# Patient Record
Sex: Male | Born: 1985 | Race: White | Hispanic: No | Marital: Single | Smoking: Current every day smoker
Health system: Southern US, Community
[De-identification: ages and names within clinical notes are randomized; demographics above are authoritative.]

## PROBLEM LIST (undated history)

## (undated) HISTORY — PX: MYRINGOTOMY WITH TUBE PLACEMENT: SHX5663

---
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
## 2006-04-09 ENCOUNTER — Emergency Department (HOSPITAL_COMMUNITY): Admission: EM | Admit: 2006-04-09 | Discharge: 2006-04-09 | Payer: Self-pay | Admitting: Emergency Medicine

## 2006-04-11 ENCOUNTER — Encounter: Admission: RE | Admit: 2006-04-11 | Discharge: 2006-04-11 | Payer: Self-pay | Admitting: Internal Medicine

## 2006-10-04 ENCOUNTER — Encounter: Admission: RE | Admit: 2006-10-04 | Discharge: 2006-10-04 | Payer: Self-pay | Admitting: Otolaryngology

## 2006-10-14 ENCOUNTER — Encounter: Admission: RE | Admit: 2006-10-14 | Discharge: 2006-10-14 | Payer: Self-pay | Admitting: Orthopedic Surgery

## 2006-11-12 ENCOUNTER — Encounter: Admission: RE | Admit: 2006-11-12 | Discharge: 2006-11-12 | Payer: Self-pay | Admitting: Otolaryngology

## 2006-11-12 ENCOUNTER — Encounter: Admission: RE | Admit: 2006-11-12 | Discharge: 2006-11-12 | Payer: Self-pay | Admitting: Orthopedic Surgery

## 2007-02-14 ENCOUNTER — Emergency Department (HOSPITAL_COMMUNITY): Admission: EM | Admit: 2007-02-14 | Discharge: 2007-02-14 | Payer: Self-pay | Admitting: Emergency Medicine

## 2007-02-14 ENCOUNTER — Inpatient Hospital Stay (HOSPITAL_COMMUNITY): Admission: RE | Admit: 2007-02-14 | Discharge: 2007-02-18 | Payer: Self-pay | Admitting: Psychiatry

## 2007-02-14 ENCOUNTER — Ambulatory Visit: Payer: Self-pay | Admitting: Psychiatry

## 2007-02-24 ENCOUNTER — Other Ambulatory Visit (HOSPITAL_COMMUNITY): Admission: RE | Admit: 2007-02-24 | Discharge: 2007-04-16 | Payer: Self-pay | Admitting: Psychiatry

## 2007-02-24 ENCOUNTER — Ambulatory Visit: Payer: Self-pay | Admitting: Psychiatry

## 2007-04-16 ENCOUNTER — Ambulatory Visit: Payer: Self-pay | Admitting: Psychiatry

## 2007-05-14 ENCOUNTER — Ambulatory Visit (HOSPITAL_COMMUNITY): Payer: Self-pay | Admitting: Psychiatry

## 2007-06-04 ENCOUNTER — Ambulatory Visit (HOSPITAL_COMMUNITY): Payer: Self-pay | Admitting: Psychiatry

## 2007-09-10 ENCOUNTER — Ambulatory Visit (HOSPITAL_COMMUNITY): Payer: Self-pay | Admitting: Psychiatry

## 2007-11-05 ENCOUNTER — Ambulatory Visit (HOSPITAL_COMMUNITY): Payer: Self-pay | Admitting: Psychiatry

## 2007-12-07 ENCOUNTER — Emergency Department (HOSPITAL_COMMUNITY): Admission: EM | Admit: 2007-12-07 | Discharge: 2007-12-07 | Payer: Self-pay | Admitting: Emergency Medicine

## 2008-02-04 ENCOUNTER — Ambulatory Visit (HOSPITAL_COMMUNITY): Payer: Self-pay | Admitting: Psychiatry

## 2010-03-18 ENCOUNTER — Encounter: Payer: Self-pay | Admitting: Otolaryngology
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
## 2010-11-15 LAB — URINE DRUGS OF ABUSE SCREEN W ALC, ROUTINE (REF LAB)
Amphetamine Screen, Ur: NEGATIVE
Barbiturate Quant, Ur: NEGATIVE
Benzodiazepines.: NEGATIVE
Benzodiazepines.: NEGATIVE
Ethyl Alcohol: <5
Ethyl Alcohol: <5
Opiate Screen, Urine: NEGATIVE
Phencyclidine (PCP): NEGATIVE
Phencyclidine (PCP): NEGATIVE
Propoxyphene: NEGATIVE

## 2010-11-15 LAB — AMPHETAMINES URINE CONFIRMATION
Amphetamines: 2100 ng/mL
Methamphetamine GC/MS, Ur: NEGATIVE

## 2010-11-16 LAB — URINE DRUGS OF ABUSE SCREEN W ALC, ROUTINE (REF LAB)
Barbiturate Quant, Ur: NEGATIVE
Benzodiazepines.: NEGATIVE
Methadone: NEGATIVE
Opiate Screen, Urine: NEGATIVE
Phencyclidine (PCP): NEGATIVE
Propoxyphene: NEGATIVE

## 2010-11-17 LAB — AMPHETAMINES URINE CONFIRMATION
Methylenedioxyamphetamine: NEGATIVE
Methylenedioxyethylamphetamine: NEGATIVE
Methylenedioxymethamphetamine: NEGATIVE

## 2010-11-17 LAB — URINE DRUGS OF ABUSE SCREEN W ALC, ROUTINE (REF LAB)
Barbiturate Quant, Ur: NEGATIVE
Benzodiazepines.: NEGATIVE
Benzodiazepines.: NEGATIVE
Cocaine Metabolites: NEGATIVE
Creatinine,U: 40
Ethyl Alcohol: <10
Methadone: NEGATIVE
Opiate Screen, Urine: NEGATIVE
Phencyclidine (PCP): NEGATIVE
Phencyclidine (PCP): NEGATIVE
Propoxyphene: NEGATIVE

## 2010-11-27 LAB — COMPREHENSIVE METABOLIC PANEL
ALT: 71 — ABNORMAL HIGH
AST: 60 — ABNORMAL HIGH
Albumin: 4.5
Alkaline Phosphatase: 75
BUN: 8
CO2: 20
Calcium: 10.1
Chloride: 111
Creatinine, Ser: 1.06
GFR calc Af Amer: >60
GFR calc non Af Amer: >60
Glucose, Bld: 106 — ABNORMAL HIGH
Potassium: 3.4 — ABNORMAL LOW
Sodium: 139
Total Bilirubin: 1.3 — ABNORMAL HIGH
Total Protein: 7.3

## 2010-11-27 LAB — RAPID URINE DRUG SCREEN, HOSP PERFORMED
Amphetamines: NOT DETECTED
Barbiturates: NOT DETECTED
Benzodiazepines: POSITIVE — AB
Cocaine: NOT DETECTED
Opiates: NOT DETECTED
Tetrahydrocannabinol: POSITIVE — AB

## 2010-11-27 LAB — URINALYSIS, ROUTINE W REFLEX MICROSCOPIC
Bilirubin Urine: NEGATIVE
Glucose, UA: NEGATIVE
Hgb urine dipstick: NEGATIVE
Ketones, ur: NEGATIVE
Nitrite: NEGATIVE
Protein, ur: NEGATIVE
Specific Gravity, Urine: 1.005
Urobilinogen, UA: 0.2
pH: 8

## 2010-11-27 LAB — CBC
HCT: 46.9
Hemoglobin: 15.9
MCHC: 34
MCV: 98
Platelets: 290
RBC: 4.78
RDW: 12.4
WBC: 10.2

## 2010-11-27 LAB — DIFFERENTIAL
Basophils Absolute: 0
Basophils Relative: 0
Lymphocytes Relative: 17
Monocytes Absolute: 1.1 — ABNORMAL HIGH
Neutro Abs: 7.2
Neutrophils Relative %: 71

## 2010-11-27 LAB — LIPASE, BLOOD: Lipase: 27

## 2010-11-27 LAB — ETHANOL: Alcohol, Ethyl (B): <5

## 2010-12-01 LAB — CBC
HCT: 41.7
Hemoglobin: 14.9
MCHC: 35.6
MCV: 93.1
Platelets: 275
RBC: 4.48
RDW: 12.6
WBC: 7.5

## 2010-12-01 LAB — BASIC METABOLIC PANEL
BUN: 8
CO2: 28
Calcium: 9.5
Chloride: 103
Creatinine, Ser: 1.4
GFR calc Af Amer: >60
GFR calc non Af Amer: >60
Glucose, Bld: 97
Potassium: 4.5
Sodium: 139

## 2010-12-01 LAB — DIFFERENTIAL
Basophils Absolute: 0
Basophils Relative: 0
Eosinophils Absolute: 0.6
Eosinophils Relative: 9 — ABNORMAL HIGH
Lymphocytes Relative: 19
Lymphs Abs: 1.5
Monocytes Absolute: 0.6
Monocytes Relative: 8
Neutro Abs: 4.8
Neutrophils Relative %: 64

## 2010-12-01 LAB — RAPID URINE DRUG SCREEN, HOSP PERFORMED
Amphetamines: NOT DETECTED
Barbiturates: NOT DETECTED
Benzodiazepines: NOT DETECTED
Cocaine: NOT DETECTED
Opiates: POSITIVE — AB
Tetrahydrocannabinol: POSITIVE — AB

## 2010-12-01 LAB — SALICYLATE LEVEL: Salicylate Lvl: <4

## 2010-12-01 LAB — HEPATIC FUNCTION PANEL
Albumin: 4.4
Total Bilirubin: 1.2

## 2010-12-01 LAB — TSH: TSH: 4.469

## 2010-12-01 LAB — ACETAMINOPHEN LEVEL: Acetaminophen (Tylenol), Serum: <10 — ABNORMAL LOW

## 2010-12-01 LAB — ETHANOL: Alcohol, Ethyl (B): <5

## 2012-10-06 ENCOUNTER — Encounter (HOSPITAL_COMMUNITY): Payer: Self-pay | Admitting: Emergency Medicine

## 2012-10-06 ENCOUNTER — Emergency Department (INDEPENDENT_AMBULATORY_CARE_PROVIDER_SITE_OTHER)
Admission: EM | Admit: 2012-10-06 | Discharge: 2012-10-06 | Disposition: A | Discharge status: Home / Self Care | Payer: Self-pay | Source: Home / Self Care | Attending: Family Medicine | Admitting: Family Medicine

## 2012-10-06 DIAGNOSIS — I10 Essential (primary) hypertension: Secondary | ICD-10-CM

## 2012-10-06 LAB — POCT I-STAT, CHEM 8
BUN: 17 mg/dL (ref 6–23)
Calcium, Ion: 1.19 mmol/L (ref 1.12–1.23)
Creatinine, Ser: 1.3 mg/dL (ref 0.50–1.35)
Glucose, Bld: 90 mg/dL (ref 70–99)
TCO2: 27 mmol/L (ref 0–100)

## 2012-10-06 NOTE — ED Provider Notes (Signed)
  CSN: [id number]     Arrival date & time 10/06/12  1918 History     None    Chief Complaint  Patient presents with  . Hypertension   (Consider location/radiation/quality/duration/timing/severity/associated sxs/prior Treatment) Patient is a 27 y.o. male presenting with hypertension. The history is provided by the patient.  Hypertension This is a chronic problem. The current episode started more than 1 week ago (has found to be elevalted over past couple mos.). The problem has been gradually worsening. Associated symptoms include chest pain and headaches. Pertinent negatives include no abdominal pain and no shortness of breath. Associated symptoms comments: Smoker, lot of job stress.Marland Kitchen    History reviewed. No pertinent past medical history. Past Surgical History  Procedure Laterality Date  . Myringotomy with tube placement     History reviewed. No pertinent family history. History  Substance Use Topics  . Smoking status: Current Every Day Smoker -- 1.00 packs/day    Types: Cigarettes  . Smokeless tobacco: Not on file  . Alcohol Use: Yes    Review of Systems  Constitutional: Negative.  Negative for appetite change.  Respiratory: Negative for shortness of breath.   Cardiovascular: Positive for chest pain. Negative for palpitations and leg swelling.  Gastrointestinal: Negative.  Negative for abdominal pain.  Genitourinary: Negative.   Neurological: Positive for headaches.    Allergies  Review of patient's allergies indicates no known allergies.  Home Medications  No current outpatient prescriptions on file. BP 142/94  Pulse 61  Temp(Src) 98.1 F (36.7 C) (Oral)  Resp 15  SpO2 99% Physical Exam  Nursing note and vitals reviewed. Constitutional: He is oriented to person, place, and time. He appears well-developed and well-nourished.  HENT:  Head: Normocephalic.  Right Ear: External ear normal.  Left Ear: External ear normal.  Mouth/Throat: Oropharynx is clear and  moist.  Eyes: EOM are normal. Pupils are equal, round, and reactive to light.  Neck: Normal range of motion. Neck supple.  Cardiovascular: Normal rate, regular rhythm, normal heart sounds and intact distal pulses.   Pulmonary/Chest: Effort normal and breath sounds normal.  Abdominal: Soft. Bowel sounds are normal. He exhibits no distension. There is no tenderness.  Lymphadenopathy:    He has no cervical adenopathy.  Neurological: He is alert and oriented to person, place, and time.  Skin: Skin is warm and dry.    ED Course   Procedures (including critical care time)  Labs Reviewed  POCT I-STAT, CHEM 8   No results found. 1. Hypertension goal BP (blood pressure) < 140/90     MDM  ecg--wnl.  Linna Hoff, MD 10/06/12 2043

## 2012-10-06 NOTE — ED Notes (Signed)
Pt reports increase in blood pressure over the past couple of months. Pt states that he is having chest pain with sob. Headaches. Some blurred vision off/on. Numbness in both left and right arm off/on.  Pt states that yesterday he broke out in to a sweat also stating that he has night sweats. Pt has been checking BP at home and it has constantly been elevated.

## 2013-05-16 ENCOUNTER — Emergency Department (HOSPITAL_COMMUNITY): Payer: BC Managed Care – PPO

## 2013-05-16 ENCOUNTER — Emergency Department (HOSPITAL_COMMUNITY)
Admission: EM | Admit: 2013-05-16 | Discharge: 2013-05-16 | Disposition: A | Discharge status: Home / Self Care | Payer: BC Managed Care – PPO | Attending: Emergency Medicine | Admitting: Emergency Medicine

## 2013-05-16 ENCOUNTER — Encounter (HOSPITAL_COMMUNITY): Payer: Self-pay | Admitting: Emergency Medicine

## 2013-05-16 DIAGNOSIS — R209 Unspecified disturbances of skin sensation: Secondary | ICD-10-CM | POA: Insufficient documentation

## 2013-05-16 DIAGNOSIS — X500XXA Overexertion from strenuous movement or load, initial encounter: Secondary | ICD-10-CM | POA: Insufficient documentation

## 2013-05-16 DIAGNOSIS — M545 Low back pain, unspecified: Secondary | ICD-10-CM | POA: Insufficient documentation

## 2013-05-16 DIAGNOSIS — M549 Dorsalgia, unspecified: Secondary | ICD-10-CM

## 2013-05-16 DIAGNOSIS — Y92009 Unspecified place in unspecified non-institutional (private) residence as the place of occurrence of the external cause: Secondary | ICD-10-CM | POA: Insufficient documentation

## 2013-05-16 DIAGNOSIS — F17200 Nicotine dependence, unspecified, uncomplicated: Secondary | ICD-10-CM | POA: Insufficient documentation

## 2013-05-16 DIAGNOSIS — Y93H9 Activity, other involving exterior property and land maintenance, building and construction: Secondary | ICD-10-CM | POA: Insufficient documentation

## 2013-05-16 MED ORDER — HYDROCODONE-ACETAMINOPHEN 5-325 MG PO TABS: 1.0000 | ORAL_TABLET | Freq: Four times a day (QID) | ORAL | Status: AC | PRN

## 2013-05-16 MED ORDER — NAPROXEN 500 MG PO TABS: 500.0000 mg | ORAL_TABLET | Freq: Two times a day (BID) | ORAL | Status: AC

## 2013-05-16 NOTE — ED Notes (Signed)
He states he was "helping a friend move some things" this morning, and upon picking up something sizeable and pivoting he heard and felt a "pop" in his low back; and has had persistent pain there ever since.  He is in no distress and denies any paresthesias/dysthesias of any of his extremities, including all toes bilat.

## 2013-05-16 NOTE — Discharge Instructions (Signed)
Your xray was negative.  SEEK IMMEDIATE MEDICAL ATTENTION IF: New numbness, tingling, weakness, or problem with the use of your arms or legs.  Severe back pain not relieved with medications.  Change in bowel or bladder control.  Increasing pain in any areas of the body (such as chest or abdominal pain).  Shortness of breath, dizziness or fainting.  Nausea (feeling sick to your stomach), vomiting, fever, or sweats.   Back Pain, Adult Low back pain is very common. About 1 in 5 people have back pain.The cause of low back pain is rarely dangerous. The pain often gets better over time.About half of people with a sudden onset of back pain feel better in just 2 weeks. About 8 in 10 people feel better by 6 weeks.  CAUSES Some common causes of back pain include:  Strain of the muscles or ligaments supporting the spine.  Wear and tear (degeneration) of the spinal discs.  Arthritis.  Direct injury to the back. DIAGNOSIS Most of the time, the direct cause of low back pain is not known.However, back pain can be treated effectively even when the exact cause of the pain is unknown.Answering your caregiver's questions about your overall health and symptoms is one of the most accurate ways to make sure the cause of your pain is not dangerous. If your caregiver needs more information, he or she may order lab work or imaging tests (X-rays or MRIs).However, even if imaging tests show changes in your back, this usually does not require surgery. HOME CARE INSTRUCTIONS For many people, back pain returns.Since low back pain is rarely dangerous, it is often a condition that people can learn to [facility] their own.   Remain active. It is stressful on the back to sit or stand in one place. Do not sit, drive, or stand in one place for more than 30 minutes at a time. Take short walks on level surfaces as soon as pain allows.Try to increase the length of time you walk each day.  Do not stay in bed.Resting  more than 1 or 2 days can delay your recovery.  Do not avoid exercise or work.Your body is made to move.It is not dangerous to be active, even though your back may hurt.Your back will likely heal faster if you return to being active before your pain is gone.  Pay attention to your body when you bend and lift. Many people have less discomfortwhen lifting if they bend their knees, keep the load close to their bodies,and avoid twisting. Often, the most comfortable positions are those that put less stress on your recovering back.  Find a comfortable position to sleep. Use a firm mattress and lie on your side with your knees slightly bent. If you lie on your back, put a pillow under your knees.  Only take over-the-counter or prescription medicines as directed by your caregiver. Over-the-counter medicines to reduce pain and inflammation are often the most helpful.Your caregiver may prescribe muscle relaxant drugs.These medicines help dull your pain so you can more quickly return to your normal activities and healthy exercise.  Put ice on the injured area.  Put ice in a plastic bag.  Place a towel between your skin and the bag.  Leave the ice on for 15-20 minutes, 03-04 times a day for the first 2 to 3 days. After that, ice and heat may be alternated to reduce pain and spasms.  Ask your caregiver about trying back exercises and gentle massage. This may be of some benefit.  Avoid feeling anxious or stressed.Stress increases muscle tension and can worsen back pain.It is important to recognize when you are anxious or stressed and learn ways to manage it.Exercise is a great option. SEEK MEDICAL CARE IF:  You have pain that is not relieved with rest or medicine.  You have pain that does not improve in 1 week.  You have new symptoms.  You are generally not feeling well. SEEK IMMEDIATE MEDICAL CARE IF:   You have pain that radiates from your back into your legs.  You develop new bowel  or bladder control problems.  You have unusual weakness or numbness in your arms or legs.  You develop nausea or vomiting.  You develop abdominal pain.  You feel faint. Document Released: 02/12/2005 Document Revised: 08/14/2011 Document Reviewed: 07/03/2010 [facility] Patient Information 2014 [employer], [place]. Naproxen and naproxen sodium oral immediate-release tablets What is this medicine? NAPROXEN (na PROX en) is a non-steroidal anti-inflammatory drug (NSAID). It is used to reduce swelling and to treat pain. This medicine may be used for dental pain, headache, or painful monthly periods. It is also used for painful joint and muscular problems such as arthritis, tendinitis, bursitis, and gout. This medicine may be used for other purposes; ask your health care provider or pharmacist if you have questions. COMMON BRAND NAME(S): Aflaxen, Aleve Arthritis, Aleve, All Day Relief, Anaprox DS, Anaprox, Naprosyn What should I tell my health care provider before I take this medicine? They need to know if you have any of these conditions: -asthma -cigarette smoker -drink more than 3 alcohol containing drinks a day -heart disease or circulation problems such as heart failure or leg edema (fluid retention) -high blood pressure -kidney disease -liver disease -stomach bleeding or ulcers -an unusual or allergic reaction to naproxen, aspirin, other NSAIDs, other medicines, foods, dyes, or preservatives -pregnant or trying to get pregnant -breast-feeding How should I use this medicine? Take this medicine by mouth with a glass of water. Follow the directions on the prescription label. Take it with food if your stomach gets upset. Try to not lie down for at least 10 minutes after you take it. Take your medicine at regular intervals. Do not take your medicine more often than directed. Long-term, continuous use may increase the risk of heart attack or stroke. A special MedGuide will be given to you by the  pharmacist with each prescription and refill. Be sure to read this information carefully each time. Talk to your pediatrician regarding the use of this medicine in children. Special care may be needed. Overdosage: If you think you have taken too much of this medicine contact a poison control center or emergency room at once. NOTE: This medicine is only for you. Do not share this medicine with others. What if I miss a dose? If you miss a dose, take it as soon as you can. If it is almost time for your next dose, take only that dose. Do not take double or extra doses. What may interact with this medicine? -alcohol -aspirin -cidofovir -diuretics -lithium -methotrexate -other drugs for inflammation like ketorolac or prednisone -pemetrexed -probenecid -warfarin This list may not describe all possible interactions. Give your health care provider a list of all the medicines, herbs, non-prescription drugs, or dietary supplements you use. Also tell them if you smoke, drink alcohol, or use illegal drugs. Some items may interact with your medicine. What should I watch for while using this medicine? Tell your doctor or health care professional if your pain does not  get better. Talk to your doctor before taking another medicine for pain. Do not treat yourself. This medicine does not prevent heart attack or stroke. In fact, this medicine may increase the chance of a heart attack or stroke. The chance may increase with longer use of this medicine and in people who have heart disease. If you take aspirin to prevent heart attack or stroke, talk with your doctor or health care professional. Do not take other medicines that contain aspirin, ibuprofen, or naproxen with this medicine. Side effects such as stomach upset, nausea, or ulcers may be more likely to occur. Many medicines available without a prescription should not be taken with this medicine. This medicine can cause ulcers and bleeding in the stomach and  intestines at any time during treatment. Do not smoke cigarettes or drink alcohol. These increase irritation to your stomach and can make it more susceptible to damage from this medicine. Ulcers and bleeding can happen without warning symptoms and can cause death. You may get drowsy or dizzy. Do not drive, use machinery, or do anything that needs mental alertness until you know how this medicine affects you. Do not stand or sit up quickly, especially if you are an older patient. This reduces the risk of dizzy or fainting spells. This medicine can cause you to bleed more easily. Try to avoid damage to your teeth and gums when you brush or floss your teeth. What side effects may I notice from receiving this medicine? Side effects that you should report to your doctor or health care professional as soon as possible: -black or bloody stools, blood in the urine or vomit -blurred vision -chest pain -difficulty breathing or wheezing -nausea or vomiting -severe stomach pain -skin rash, skin redness, blistering or peeling skin, hives, or itching -slurred speech or weakness on one side of the body -swelling of eyelids, throat, lips -unexplained weight gain or swelling -unusually weak or tired -yellowing of eyes or skin Side effects that usually do not require medical attention (report to your doctor or health care professional if they continue or are bothersome): -constipation -headache -heartburn This list may not describe all possible side effects. Call your doctor for medical advice about side effects. You may report side effects to FDA at 1-800-FDA-1088. Where should I keep my medicine? Keep out of the reach of children. Store at room temperature between 15 and 30 degrees C (59 and 86 degrees F). Keep container tightly closed. Throw away any unused medicine after the expiration date. NOTE: This sheet is a summary. It may not cover all possible information. If you have questions about this  medicine, talk to your doctor, pharmacist, or health care provider.  2014, Elsevier/Gold Standard. (2009-02-14 20:10:16) Acetaminophen; Hydrocodone tablets or capsules What is this medicine? ACETAMINOPHEN; HYDROCODONE (a set a MEE noe fen; hye droe KOE done) is a pain reliever. It is used to treat mild to moderate pain. This medicine may be used for other purposes; ask your health care provider or pharmacist if you have questions. COMMON BRAND NAME(S): Anexsia, Bancap HC , Ceta-Plus, Co-Gesic, Comfortpak , Dolagesic, [place], [address]/[employer] , [address] , Hydrogesic, Lorcet HD, Lorcet Plus, Lorcet, Ryan, Margesic H, Maxidone, [place], Polygesic, [place], [place], Vicodin ES, Vicodin HP, Vicodin, [place] Baseman What should I tell my health care provider before I take this medicine? They need to know if you have any of these conditions: -brain tumor -Crohn's disease, inflammatory bowel disease, or ulcerative colitis -drug abuse or addiction -head injury -heart or circulation  problems -if you often drink alcohol -kidney disease or problems going to the bathroom -liver disease -lung disease, asthma, or breathing problems -an unusual or allergic reaction to acetaminophen, hydrocodone, other opioid analgesics, other medicines, foods, dyes, or preservatives -pregnant or trying to get pregnant -breast-feeding How should I use this medicine? Take this medicine by mouth. Swallow it with a full glass of water. Follow the directions on the prescription label. If the medicine upsets your stomach, take the medicine with food or milk. Do not take more than you are told to take. Talk to your pediatrician regarding the use of this medicine in children. This medicine is not approved for use in children. Overdosage: If you think you have taken too much of this medicine contact a poison control center or emergency room at once. NOTE: This medicine is only for you. Do not share this medicine with others. What if I  miss a dose? If you miss a dose, take it as soon as you can. If it is almost time for your next dose, take only that dose. Do not take double or extra doses. What may interact with this medicine? -alcohol -antihistamines -isoniazid -medicines for depression, anxiety, or psychotic disturbances -medicines for sleep -muscle relaxants -naltrexone -narcotic medicines (opiates) for pain -phenobarbital -ritonavir -tramadol This list may not describe all possible interactions. Give your health care provider a list of all the medicines, herbs, non-prescription drugs, or dietary supplements you use. Also tell them if you smoke, drink alcohol, or use illegal drugs. Some items may interact with your medicine. What should I watch for while using this medicine? Tell your doctor or health care professional if your pain does not go away, if it gets worse, or if you have new or a different type of pain. You may develop tolerance to the medicine. Tolerance means that you will need a higher dose of the medicine for pain relief. Tolerance is normal and is expected if you take the medicine for a long time. Do not suddenly stop taking your medicine because you may develop a severe reaction. Your body becomes used to the medicine. This does NOT mean you are addicted. Addiction is a behavior related to getting and using a drug for a non-medical reason. If you have pain, you have a medical reason to take pain medicine. Your doctor will tell you how much medicine to take. If your doctor wants you to stop the medicine, the dose will be slowly lowered over time to avoid any side effects. You may get drowsy or dizzy when you first start taking the medicine or change doses. Do not drive, use machinery, or do anything that may be dangerous until you know how the medicine affects you. Stand or sit up slowly. There are different types of narcotic medicines (opiates) for pain. If you take more than one type at the same time, you  may have more side effects. Give your health care provider a list of all medicines you use. Your doctor will tell you how much medicine to take. Do not take more medicine than directed. Call emergency for help if you have problems breathing. The medicine will cause constipation. Try to have a bowel movement at least every 2 to 3 days. If you do not have a bowel movement for 3 days, call your doctor or health care professional. Too much acetaminophen can be very dangerous. Do not take Tylenol (acetaminophen) or medicines that contain acetaminophen with this medicine. Many non-prescription medicines contain acetaminophen. Always read  the labels carefully. What side effects may I notice from receiving this medicine? Side effects that you should report to your doctor or health care professional as soon as possible: -allergic reactions like skin rash, itching or hives, swelling of the face, lips, or tongue -breathing problems -confusion -feeling faint or lightheaded, falls -stomach pain -yellowing of the eyes or skin Side effects that usually do not require medical attention (report to your doctor or health care professional if they continue or are bothersome): -nausea, vomiting -stomach upset This list may not describe all possible side effects. Call your doctor for medical advice about side effects. You may report side effects to FDA at 1-800-FDA-1088. Where should I keep my medicine? Keep out of the reach of children. This medicine can be abused. Keep your medicine in a safe place to protect it from theft. Do not share this medicine with anyone. Selling or giving away this medicine is dangerous and against the law. Store at room temperature between 15 and 30 degrees C (59 and 86 degrees F). Protect from light. Keep container tightly closed.  Throw away any unused medicine after the expiration date. Discard unused medicine and used packaging carefully. Pets and children can be harmed if they find used  or lost packages. NOTE: This sheet is a summary. It may not cover all possible information. If you have questions about this medicine, talk to your doctor, pharmacist, or health care provider.  2014, Elsevier/Gold Standard. (2012-10-06 13:15:56)

## 2013-05-16 NOTE — ED Provider Notes (Signed)
CSN: [id number]     Arrival date & time 05/16/13  1333 History  This chart was scribed for non-physician practitioner Connor Hawkins working with Lyanne Co, MD by Carl Best, ED Scribe. This patient was seen in room WTR9/WTR9 and the patient's care was started at 2:26 PM.    Chief Complaint  Patient presents with  . Back Pain     Patient is a 27 y.o. male presenting with back pain. The history is provided by the patient. No language interpreter was used.  Back Pain Associated symptoms: numbness (right leg)    HPI Comments: Connor Hawkins is a 28 y.o. male who presents to the Emergency Department complaining of constant back pain that started today at 1 PM after he picked up a heavy box while helping a friend clean his yard.  He states that he is unable to bend over or pivot.  He states that movement aggravates his pain.  He states that he is experiencing tingling and weakness in his right leg.  He denies loss of bowel or bladder function and rashes as associated symptoms.  He states that he took 2 Aleve and another pain medication for his symptoms with little relief.  He states that he has had a history of back pain but nothing as severe as what he is experiencing now.     History reviewed. No pertinent past medical history. Past Surgical History  Procedure Laterality Date  . Myringotomy with tube placement     No family history on file. History  Substance Use Topics  . Smoking status: Current Every Day Smoker -- 1.00 packs/day    Types: Cigarettes  . Smokeless tobacco: Not on file  . Alcohol Use: Yes    Review of Systems  Musculoskeletal: Positive for back pain.  Skin: Negative for rash.  Neurological: Positive for numbness (right leg).  All other systems reviewed and are negative.      Allergies  Review of patient's allergies indicates no known allergies.  Home Medications   Current Outpatient Rx  Name  Route  Sig  Dispense  Refill  . b complex vitamins  tablet   Oral   Take 1 tablet by mouth daily.         . Ginger, Zingiber officinalis, (GINGER ROOT PO)   Oral   Take 1 tablet by mouth daily.         Marland Kitchen glucosamine-chondroitin 500-400 MG tablet   Oral   Take 1 tablet by mouth daily.         . Multiple Vitamin (MULTIVITAMIN WITH MINERALS) TABS tablet   Oral   Take 1 tablet by mouth daily.         . nicotine (NICODERM CQ - DOSED IN MG/24 HOURS) 21 mg/24hr patch   Transdermal   Place 21 mg onto the skin daily.         Marland Kitchen OVER THE COUNTER MEDICATION   Oral   Take 1 tablet by mouth daily. Supplement called Brain awake         . vitamin E 400 UNIT capsule   Oral   Take 400 Units by mouth daily.          Triage Vitals: BP 132/74  Pulse 86  Temp(Src) 97.9 F (36.6 C) (Oral)  Resp 16  SpO2 99%  Physical Exam  Nursing note and vitals reviewed. Constitutional: He is oriented to person, place, and time. He appears well-developed and well-nourished. No distress.  HENT:  Head: Normocephalic  and atraumatic.  Eyes: EOM are normal.  Neck: Neck supple. No tracheal deviation present.  Cardiovascular: Normal rate.   Pulmonary/Chest: Effort normal. No respiratory distress.  Musculoskeletal: Normal range of motion.  No midline tenderness.  Tender in the right lower lumbar more so than the left.    Neurological: He is alert and oriented to person, place, and time.  Skin: Skin is warm and dry.  Psychiatric: He has a normal mood and affect. His behavior is normal.    ED Course  Procedures (including critical care time)  DIAGNOSTIC STUDIES: Oxygen Saturation is 99% on room air, normal by my interpretation.    COORDINATION OF CARE: 2:29 PM- Discussed obtaining an x-ray of the patient's back and discharging the patient with a prescription for pain medication.  The patient agreed to the treatment plan.    Labs Review Labs Reviewed - No data to display Imaging Review Dg Lumbar Spine Complete  05/16/2013   CLINICAL  DATA:  Low back pain after injury.  EXAM: LUMBAR SPINE - COMPLETE 4+ VIEW  COMPARISON:  MRI of November 12, 2006.  FINDINGS: There is no evidence of lumbar spine fracture. Alignment is normal. Intervertebral disc spaces are maintained.  IMPRESSION: Normal lumbar spine.   Electronically Signed   By: Roque LiasJames  Green M.D.   On: 05/16/2013 14:55     EKG Interpretation None      MDM   Final diagnoses:  Back pain    I personally reviewed the images using our PACS system. ,Patient with back pain.  No neurological deficits and normal neuro exam.  Patient can walk but states is painful.  No loss of bowel or bladder control.  No concern for cauda equina.  No fever, night sweats, weight loss, h/o cancer, IVDU.  RICE protocol and pain medicine indicated and discussed with patient.    I personally performed the services described in this documentation, which was scribed in my presence. The recorded information has been reviewed and is accurate.     Connor Captainbigail Qunisha Bryk, PA-C 05/16/13 1510

## 2013-05-16 NOTE — ED Provider Notes (Signed)
Medical screening examination/treatment/procedure(s) were performed by non-physician practitioner and as supervising physician I was immediately available for consultation/collaboration.   EKG Interpretation None        Lyanne CoKevin M Samad Thon, MD 05/16/13 [phone]

## 2015-02-02 IMAGING — CR DG LUMBAR SPINE COMPLETE 4+V
5 series · 5 of 5 positions shown · non-contrast
Comparison: MRI November 12, 2006.

CLINICAL DATA: Low back pain after injury.

EXAM:
LUMBAR SPINE - COMPLETE 4+ VIEW

[t lumbar spine ap]
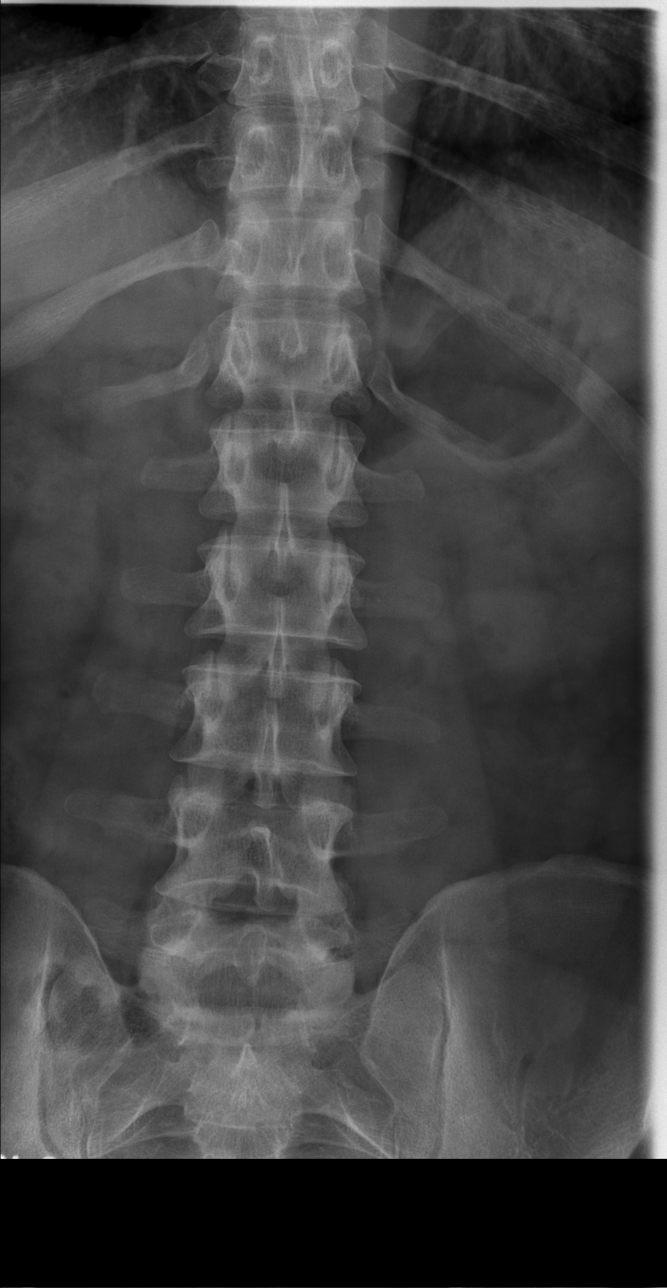

[t lumbar spine obl (1 of 2)]
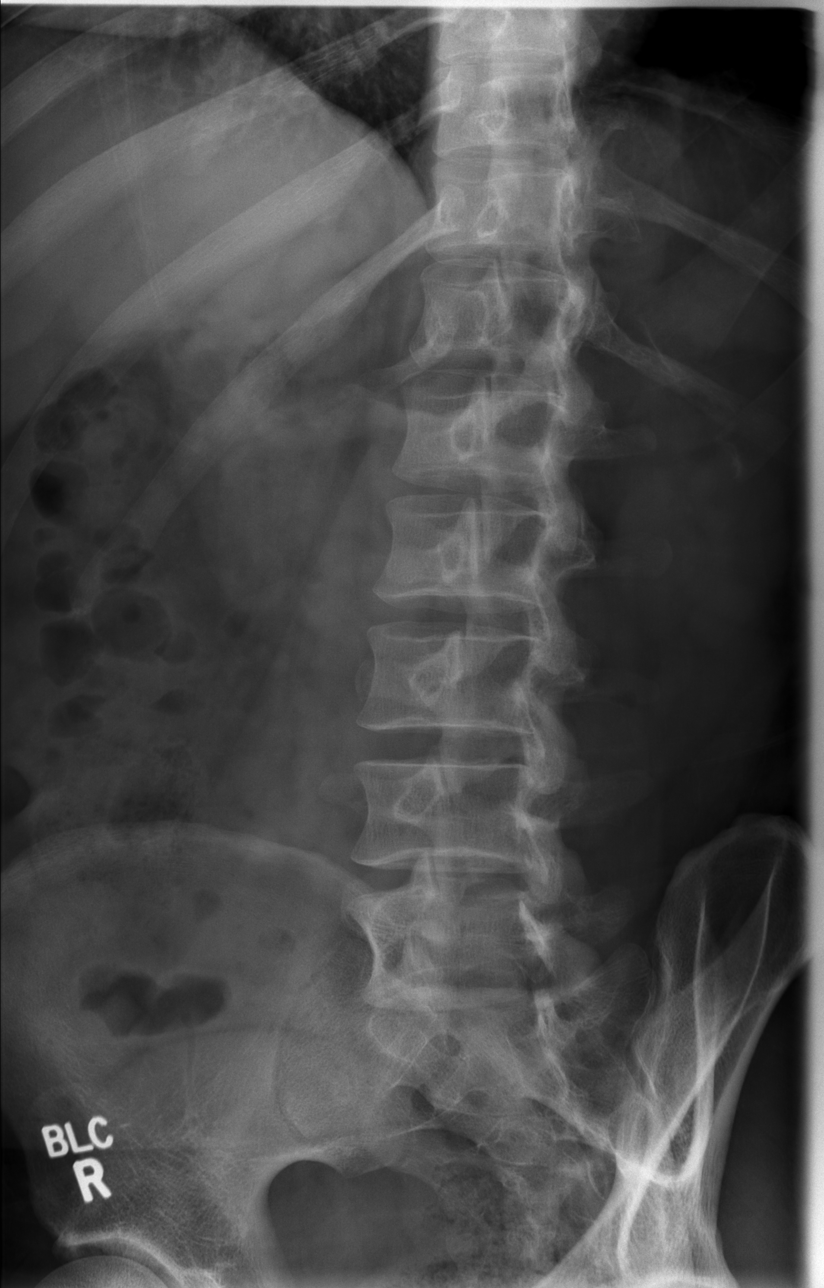

[t lumbar spine obl (2 of 2)]
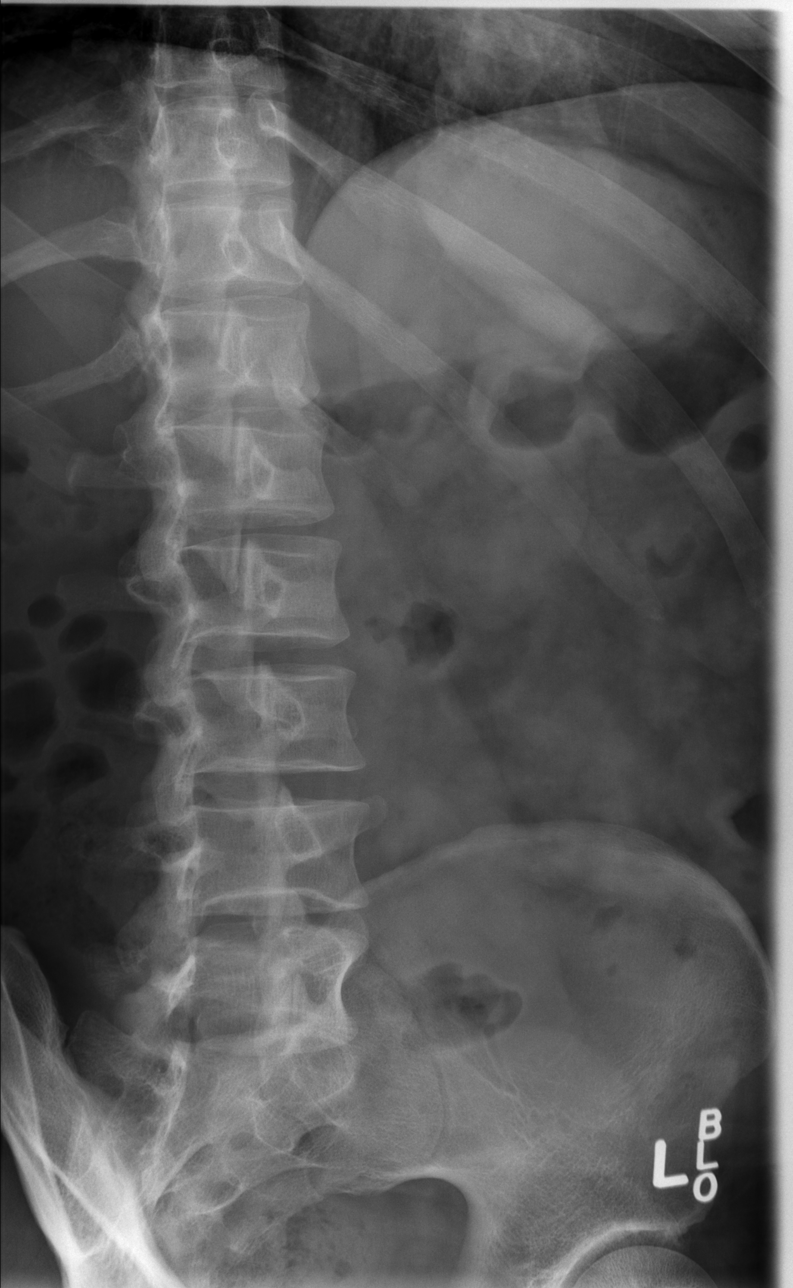

[t lumbar spine lat]
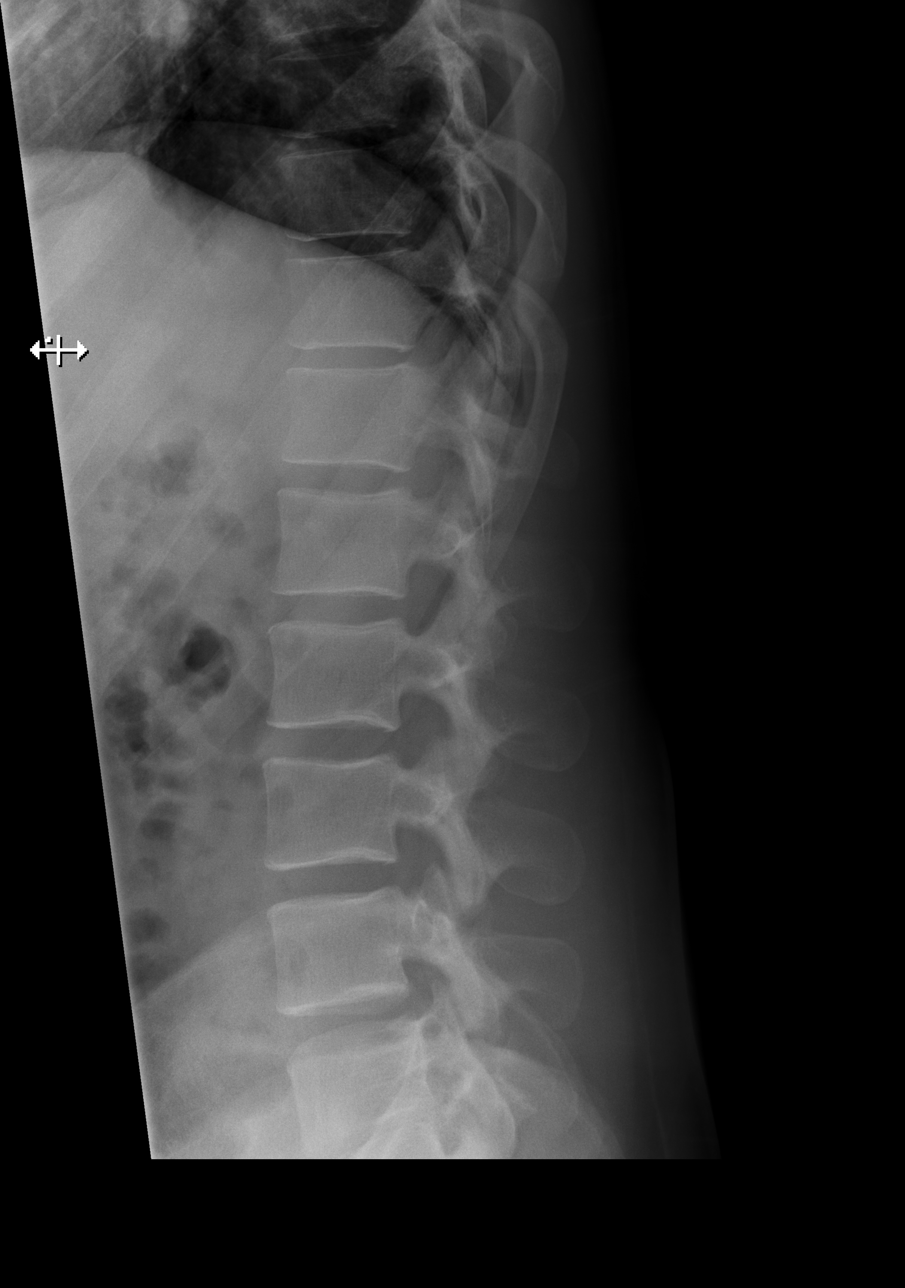

[t lumbar l-5 s-1 spot]
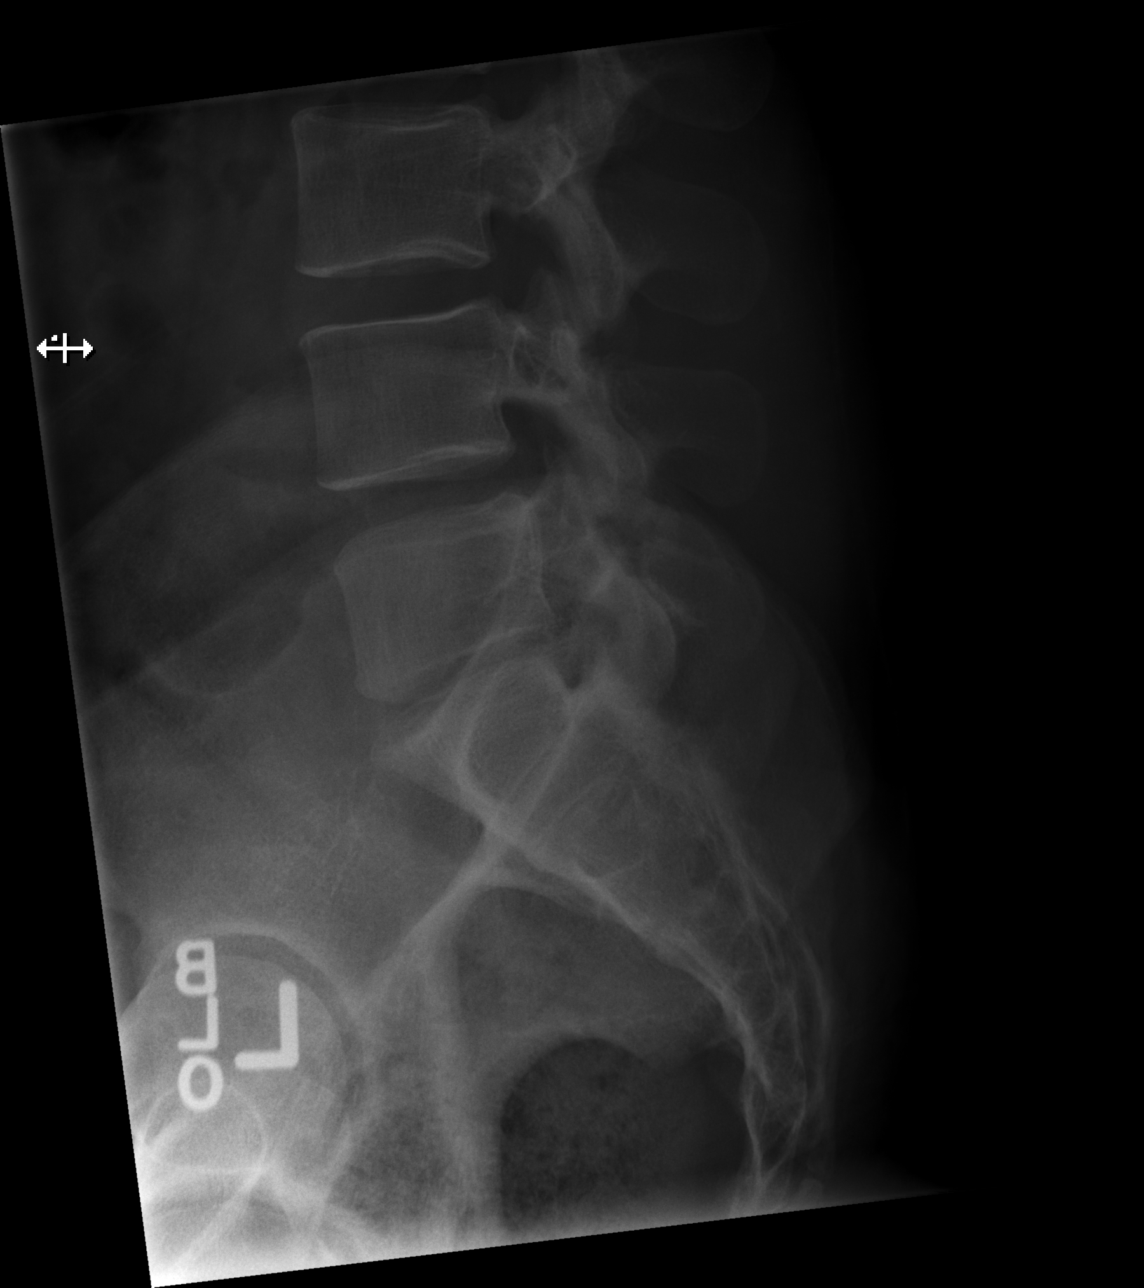

[5 of 5 positions shown; findings below may reference images not displayed]

FINDINGS: There is no evidence of lumbar spine fracture. Alignment is normal.
Intervertebral disc spaces are maintained.
IMPRESSION: Normal lumbar spine.

## 2018-09-25 ENCOUNTER — Encounter (HOSPITAL_BASED_OUTPATIENT_CLINIC_OR_DEPARTMENT_OTHER): Payer: Self-pay

## 2018-09-25 ENCOUNTER — Ambulatory Visit (HOSPITAL_BASED_OUTPATIENT_CLINIC_OR_DEPARTMENT_OTHER): Admit: 2018-09-25 | Payer: Self-pay | Admitting: Orthopedic Surgery

## 2018-09-25 SURGERY — OPEN REDUCTION INTERNAL FIXATION (ORIF) FOOT LISFRANC FRACTURE
Anesthesia: General | Laterality: Left
